# Patient Record
Sex: Male | Born: 1995 | Race: Black or African American | Hispanic: No | Marital: Single
Health system: Southern US, Community
[De-identification: ages and names within clinical notes are randomized; demographics above are authoritative.]

---

## 2020-10-07 ENCOUNTER — Emergency Department (HOSPITAL_COMMUNITY): Payer: Self-pay

## 2020-10-07 ENCOUNTER — Other Ambulatory Visit: Payer: Self-pay

## 2020-10-07 ENCOUNTER — Encounter (HOSPITAL_COMMUNITY): Payer: Self-pay | Admitting: Emergency Medicine

## 2020-10-07 ENCOUNTER — Emergency Department (HOSPITAL_COMMUNITY)
Admission: EM | Admit: 2020-10-07 | Discharge: 2020-10-07 | Disposition: A | Discharge status: Home / Self Care | Payer: Self-pay | Attending: Emergency Medicine | Admitting: Emergency Medicine

## 2020-10-07 DIAGNOSIS — S0081XA Abrasion of other part of head, initial encounter: Secondary | ICD-10-CM | POA: Insufficient documentation

## 2020-10-07 DIAGNOSIS — X58XXXA Exposure to other specified factors, initial encounter: Secondary | ICD-10-CM | POA: Insufficient documentation

## 2020-10-07 DIAGNOSIS — Z23 Encounter for immunization: Secondary | ICD-10-CM | POA: Insufficient documentation

## 2020-10-07 DIAGNOSIS — R000 Tachycardia, unspecified: Secondary | ICD-10-CM | POA: Insufficient documentation

## 2020-10-07 DIAGNOSIS — T1490XA Injury, unspecified, initial encounter: Secondary | ICD-10-CM

## 2020-10-07 DIAGNOSIS — T07XXXA Unspecified multiple injuries, initial encounter: Secondary | ICD-10-CM

## 2020-10-07 DIAGNOSIS — S71102A Unspecified open wound, left thigh, initial encounter: Secondary | ICD-10-CM | POA: Insufficient documentation

## 2020-10-07 LAB — COMPREHENSIVE METABOLIC PANEL
ALT: 129 U/L — ABNORMAL HIGH (ref 0–44)
AST: 110 U/L — ABNORMAL HIGH (ref 15–41)
Albumin: 4.7 g/dL (ref 3.5–5.0)
Alkaline Phosphatase: 63 U/L (ref 38–126)
Anion gap: 16 — ABNORMAL HIGH (ref 5–15)
BUN: 7 mg/dL (ref 6–20)
CO2: 19 mmol/L — ABNORMAL LOW (ref 22–32)
Calcium: 9.5 mg/dL (ref 8.9–10.3)
Chloride: 107 mmol/L (ref 98–111)
Creatinine, Ser: 1.18 mg/dL (ref 0.61–1.24)
GFR, Estimated: >60 mL/min (ref >60)
Glucose, Bld: 108 mg/dL — ABNORMAL HIGH (ref 70–99)
Potassium: 3.7 mmol/L (ref 3.5–5.1)
Sodium: 142 mmol/L (ref 135–145)
Total Bilirubin: 0.8 mg/dL (ref 0.3–1.2)
Total Protein: 7.2 g/dL (ref 6.5–8.1)

## 2020-10-07 LAB — HEPATITIS PANEL, ACUTE
HCV Ab: NONREACTIVE
Hep A IgM: NONREACTIVE
Hep B C IgM: NONREACTIVE
Hepatitis B Surface Ag: NONREACTIVE

## 2020-10-07 LAB — CBC
HCT: 42.3 % (ref 39.0–52.0)
Hemoglobin: 14.2 g/dL (ref 13.0–17.0)
MCH: 32.6 pg (ref 26.0–34.0)
MCHC: 33.6 g/dL (ref 30.0–36.0)
MCV: 97.2 fL (ref 80.0–100.0)
Platelets: 285 10*3/uL (ref 150–400)
RBC: 4.35 MIL/uL (ref 4.22–5.81)
RDW: 12.2 % (ref 11.5–15.5)
WBC: 8.5 10*3/uL (ref 4.0–10.5)
nRBC: 0 % (ref 0.0–0.2)

## 2020-10-07 LAB — I-STAT CHEM 8, ED
BUN: 8 mg/dL (ref 6–20)
Calcium, Ion: 1.07 mmol/L — ABNORMAL LOW (ref 1.15–1.40)
Chloride: 107 mmol/L (ref 98–111)
Creatinine, Ser: 1.3 mg/dL — ABNORMAL HIGH (ref 0.61–1.24)
Glucose, Bld: 99 mg/dL (ref 70–99)
HCT: 41 % (ref 39.0–52.0)
Hemoglobin: 13.9 g/dL (ref 13.0–17.0)
Potassium: 4.5 mmol/L (ref 3.5–5.1)
Sodium: 145 mmol/L (ref 135–145)
TCO2: 23 mmol/L (ref 22–32)

## 2020-10-07 LAB — SAMPLE TO BLOOD BANK

## 2020-10-07 LAB — ETHANOL: Alcohol, Ethyl (B): 190 mg/dL — ABNORMAL HIGH (ref <10)

## 2020-10-07 LAB — PROTIME-INR
INR: 0.9 (ref 0.8–1.2)
Prothrombin Time: 12.1 seconds (ref 11.4–15.2)

## 2020-10-07 LAB — LACTIC ACID, PLASMA: Lactic Acid, Venous: 7.5 mmol/L (ref 0.5–1.9)

## 2020-10-07 LAB — RAPID HIV SCREEN (HIV 1/2 AB+AG)
HIV 1/2 Antibodies: NONREACTIVE
HIV-1 P24 Antigen - HIV24: NONREACTIVE

## 2020-10-07 MED ORDER — TETANUS-DIPHTH-ACELL PERTUSSIS 5-2.5-18.5 LF-MCG/0.5 IM SUSY
0.5000 mL | PREFILLED_SYRINGE | Freq: Once | INTRAMUSCULAR | Status: AC
  Administered 2020-10-07: 0.5 mL via INTRAMUSCULAR
  Filled 2020-10-07: qty 0.5

## 2020-10-07 MED ORDER — LACTATED RINGERS IV BOLUS
2000.0000 mL | Freq: Once | INTRAVENOUS | Status: AC
  Administered 2020-10-07: 2000 mL via INTRAVENOUS

## 2020-10-07 MED ORDER — KETOROLAC TROMETHAMINE 30 MG/ML IJ SOLN
30.0000 mg | Freq: Once | INTRAMUSCULAR | Status: AC
  Administered 2020-10-07: 30 mg via INTRAVENOUS
  Filled 2020-10-07: qty 1

## 2020-10-07 MED ORDER — LACTATED RINGERS IV BOLUS
1000.0000 mL | Freq: Once | INTRAVENOUS | Status: AC
  Administered 2020-10-07: 1000 mL via INTRAVENOUS

## 2020-10-07 MED ORDER — IOHEXOL 350 MG/ML SOLN
100.0000 mL | Freq: Once | INTRAVENOUS | Status: AC | PRN
  Administered 2020-10-07: 100 mL via INTRAVENOUS

## 2020-10-07 NOTE — ED Notes (Signed)
Pt educated on crutches and wound care. Pt able to demonstrate using crutches ands states understanding of at home care for wounds

## 2020-10-07 NOTE — Discharge Instructions (Addendum)
Please use the crutches for the next week.  Keep your left leg elevated when resting.  If you have any new weakness or numbness in your leg, severe pain in the wounds, or any drainage over the next 2 days please return to the ER Otherwise the wound should heal on their own.  Please change the bandage every day

## 2020-10-07 NOTE — ED Notes (Signed)
Tourniquet removed at 0142.

## 2020-10-07 NOTE — ED Notes (Signed)
Pt provided water for PO challenge
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
## 2020-10-07 NOTE — ED Notes (Signed)
Dressing changed and wounds cleaned. Wrapped in ACE bandage and elevated

## 2022-01-05 IMAGING — DX DG TIBIA/FIBULA PORT 2V*L*
1 series · 4 of 4 positions shown · non-contrast
Comparison: None.

CLINICAL DATA: Trauma

EXAM:
PORTABLE LEFT TIBIA AND FIBULA - 2 VIEW

[Series 1: leg · 0.14mm/px · 4 of 4 slices shown]
[im 1/4]
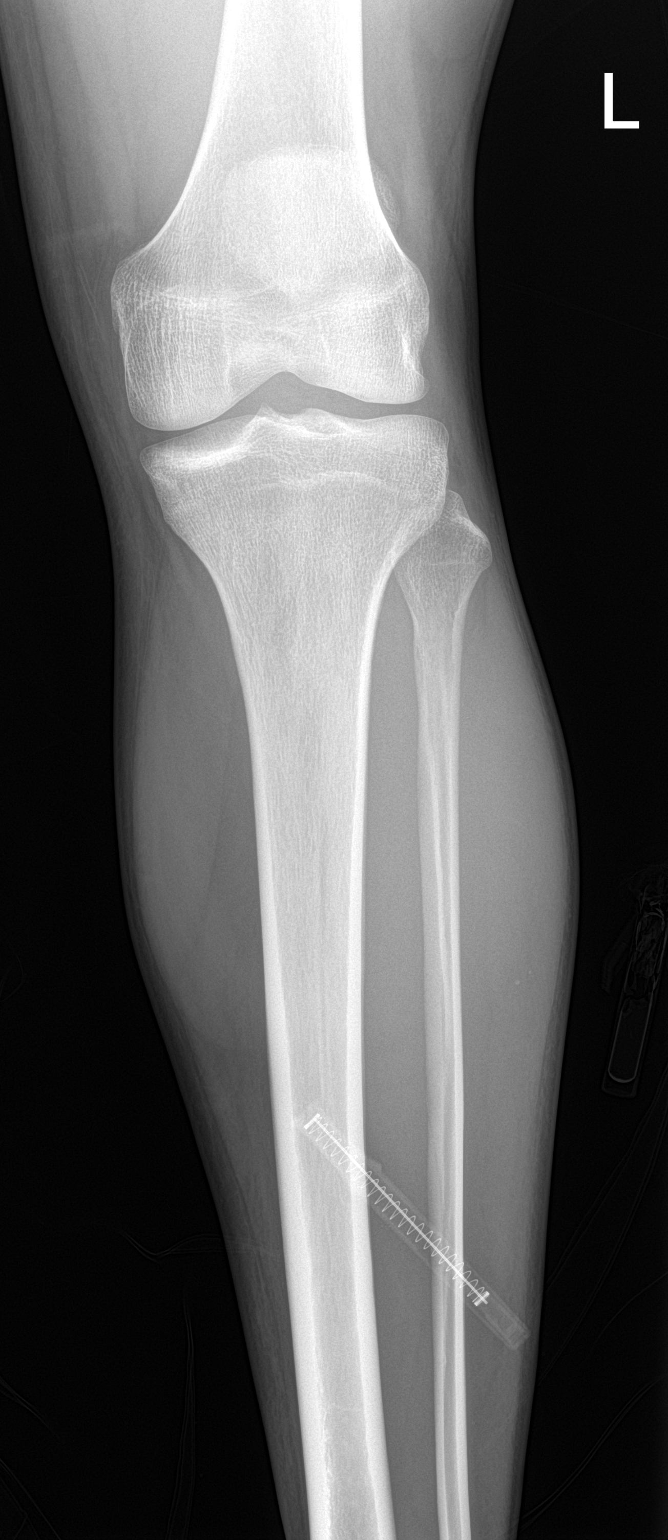
[im 2/4]
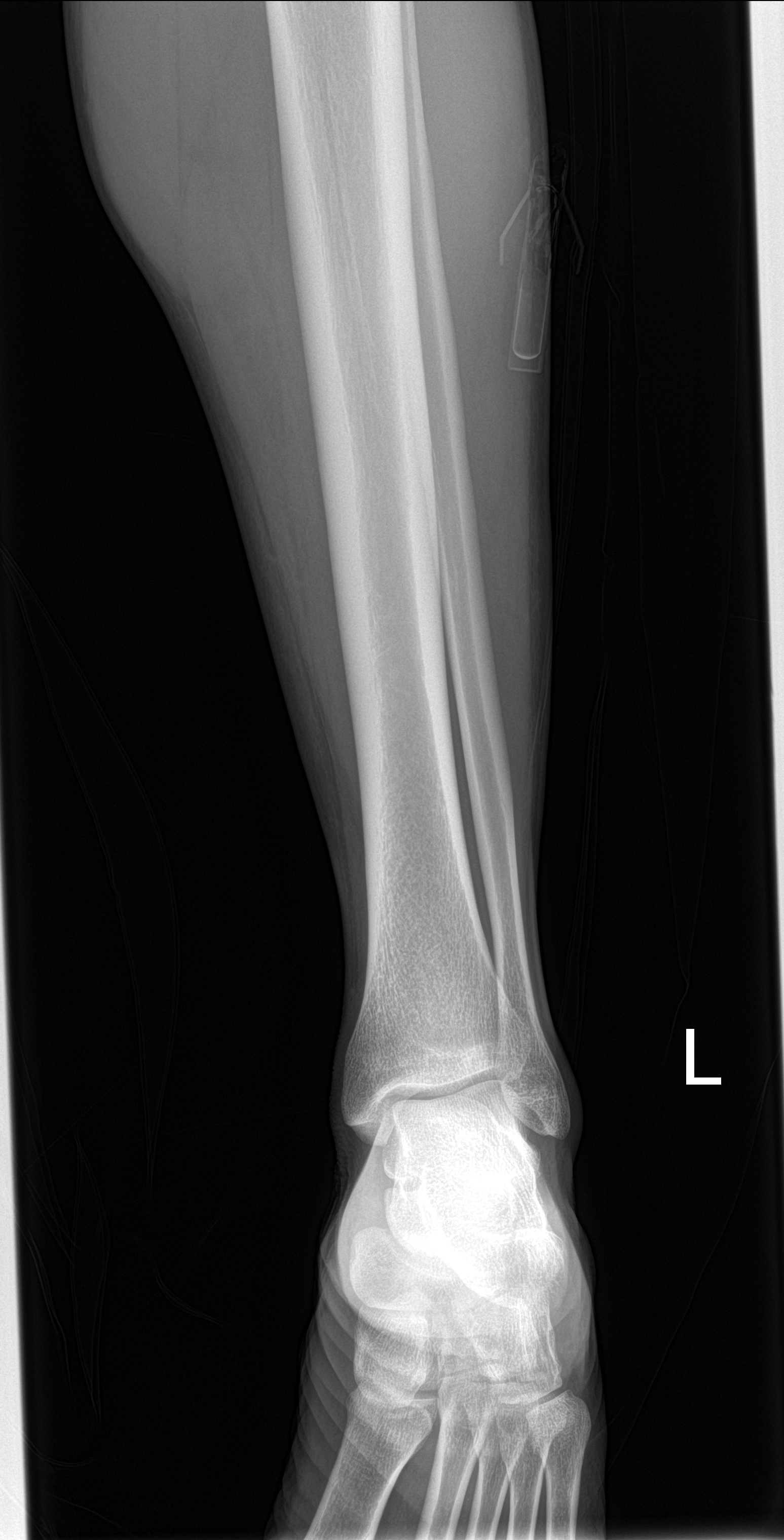
[im 3/4]
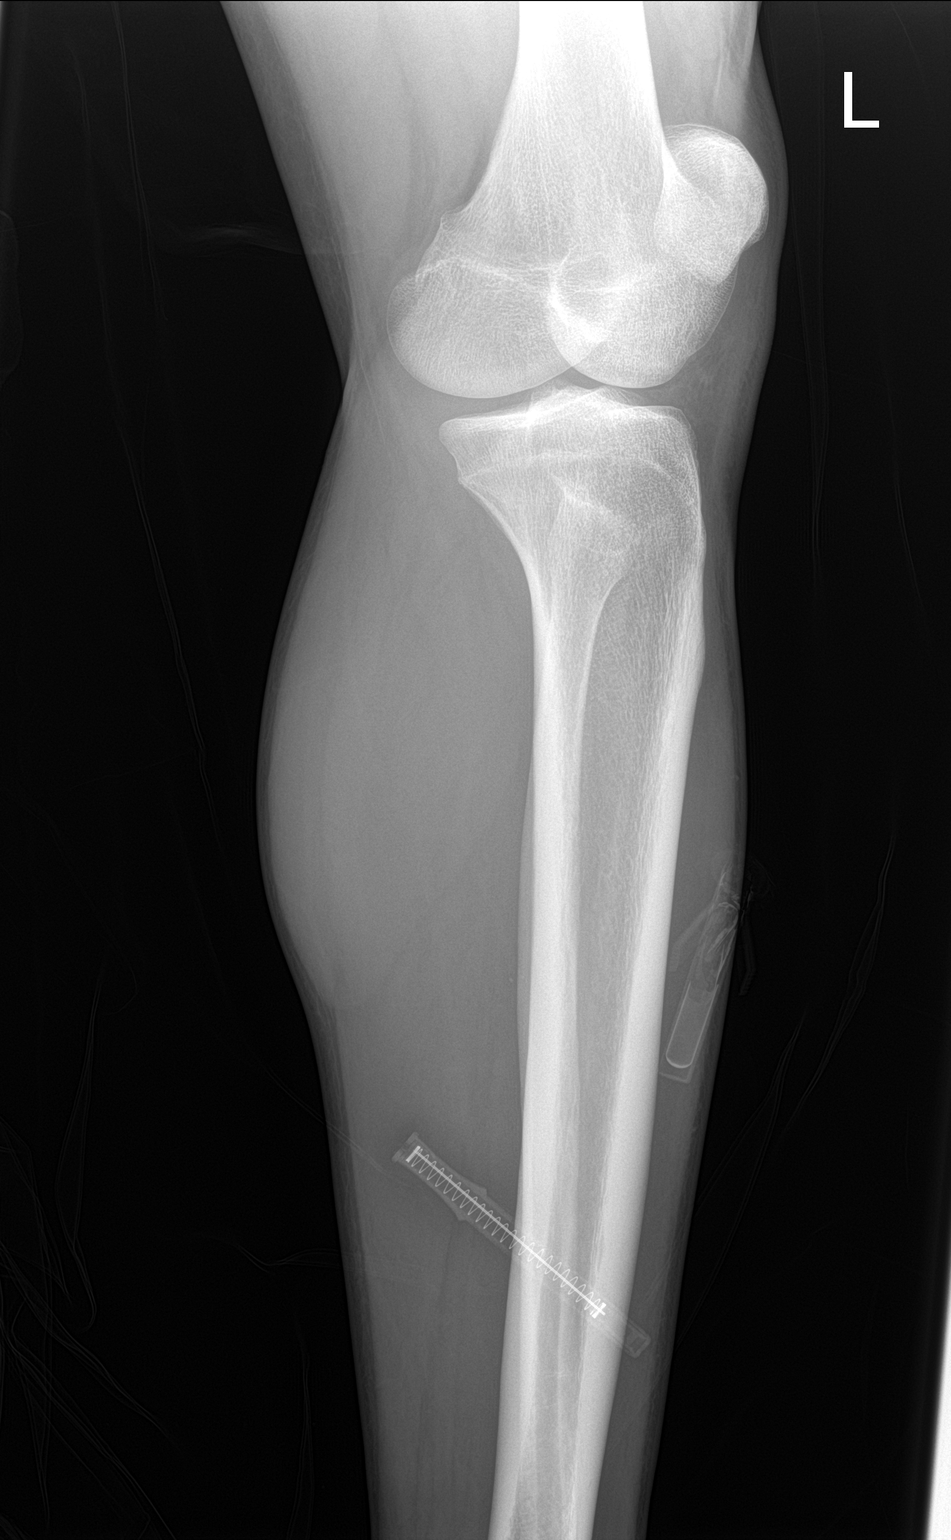
[im 4/4]
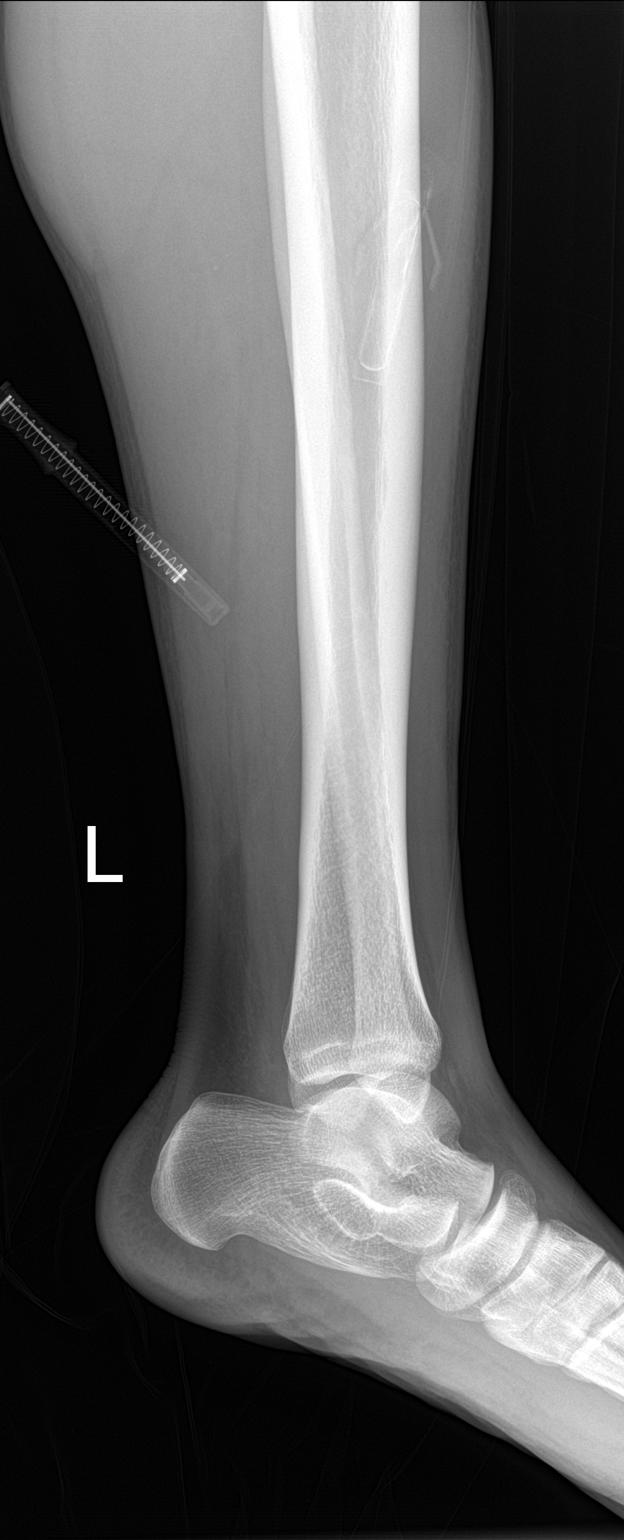

[4 of 4 positions shown; findings below may reference images not displayed]

FINDINGS: No fracture.  No retained foreign body.  Ankle is aligned.
IMPRESSION: Negative.
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
